# Patient Record
Sex: Female | Born: 1995 | Race: White | Hispanic: No | Marital: Single
Health system: Southern US, Community
[De-identification: ages and names within clinical notes are randomized; demographics above are authoritative.]

---
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
## 2017-03-12 ENCOUNTER — Other Ambulatory Visit: Payer: Self-pay | Admitting: Gastroenterology

## 2017-03-12 DIAGNOSIS — R1011 Right upper quadrant pain: Secondary | ICD-10-CM

## 2017-03-25 ENCOUNTER — Encounter (HOSPITAL_COMMUNITY): Payer: BLUE CROSS/BLUE SHIELD

## 2017-03-25 ENCOUNTER — Ambulatory Visit (HOSPITAL_COMMUNITY): Payer: BLUE CROSS/BLUE SHIELD

## 2017-03-29 ENCOUNTER — Ambulatory Visit (HOSPITAL_COMMUNITY)
Admission: RE | Admit: 2017-03-29 | Discharge: 2017-03-29 | Disposition: A | Discharge status: Home / Self Care | Payer: BLUE CROSS/BLUE SHIELD | Source: Ambulatory Visit | Attending: Gastroenterology | Admitting: Gastroenterology

## 2017-03-29 ENCOUNTER — Encounter (HOSPITAL_COMMUNITY)
Admission: RE | Admit: 2017-03-29 | Discharge: 2017-03-29 | Disposition: A | Discharge status: Home / Self Care | Payer: BLUE CROSS/BLUE SHIELD | Source: Ambulatory Visit | Attending: Gastroenterology | Admitting: Gastroenterology

## 2017-03-29 ENCOUNTER — Encounter (HOSPITAL_COMMUNITY): Payer: Self-pay

## 2017-03-29 DIAGNOSIS — R1011 Right upper quadrant pain: Secondary | ICD-10-CM | POA: Insufficient documentation

## 2017-03-29 DIAGNOSIS — R110 Nausea: Secondary | ICD-10-CM | POA: Diagnosis present

## 2017-03-29 MED ORDER — TECHNETIUM TC 99M MEBROFENIN IV KIT
5.0000 | PACK | Freq: Once | INTRAVENOUS | Status: AC | PRN
  Administered 2017-03-29: 5 via INTRAVENOUS

## 2018-03-13 ENCOUNTER — Other Ambulatory Visit: Payer: Self-pay | Admitting: Gastroenterology

## 2018-03-13 DIAGNOSIS — R1012 Left upper quadrant pain: Secondary | ICD-10-CM

## 2018-03-19 ENCOUNTER — Ambulatory Visit
Admission: RE | Admit: 2018-03-19 | Discharge: 2018-03-19 | Disposition: A | Discharge status: Home / Self Care | Payer: BLUE CROSS/BLUE SHIELD | Source: Ambulatory Visit | Attending: Gastroenterology | Admitting: Gastroenterology

## 2018-03-19 DIAGNOSIS — R1012 Left upper quadrant pain: Secondary | ICD-10-CM

## 2018-03-19 MED ORDER — IOPAMIDOL (ISOVUE-300) INJECTION 61%
100.0000 mL | Freq: Once | INTRAVENOUS | Status: AC | PRN
  Administered 2018-03-19: 100 mL via INTRAVENOUS

## 2018-09-06 IMAGING — NM NM HEPATO W/GB/PHARM/[PERSON_NAME]
3 series · 13 of 13 positions shown · non-contrast
Comparison: None.

CLINICAL DATA: Right upper quadrant pain.

EXAM:
NUCLEAR MEDICINE HEPATOBILIARY IMAGING WITH GALLBLADDER EF
TECHNIQUE: Sequential images of the abdomen were obtained [DATE] minutes
following intravenous administration of radiopharmaceutical. After
oral ingestion of Ensure, gallbladder ejection fraction was
determined. At 60 min, normal ejection fraction is greater than 33%.
RADIOPHARMACEUTICALS:  5.0 mCi Ec-JJm  Choletec IV

[he hepatobiliary · 3.10mm/px · 6 of 60 frames shown (1 of 3)]
[frame 6/60]
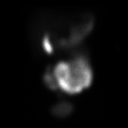
[frame 16/60]
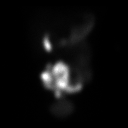
[frame 26/60]
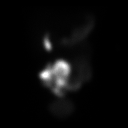
[frame 36/60]
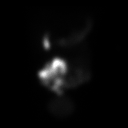
[frame 46/60]
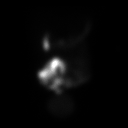
[frame 56/60]
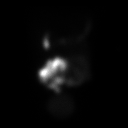

[he hepatobiliary · 2.26mm/px · 1 of 1 slices shown (2 of 3)]
[im 1/1]
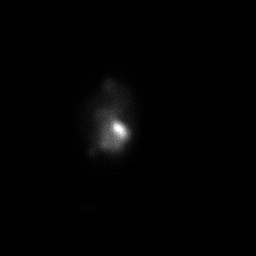

[he hepatobiliary · 3.10mm/px · 6 of 60 frames shown (3 of 3)]
[frame 6/60]
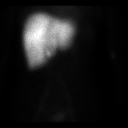
[frame 16/60]
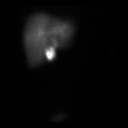
[frame 26/60]
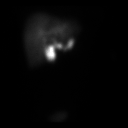
[frame 36/60]
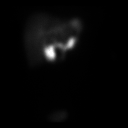
[frame 46/60]
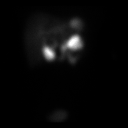
[frame 56/60]
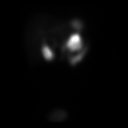

[13 of 13 positions shown; findings below may reference images not displayed]

FINDINGS: Prompt uptake and biliary excretion of activity by the liver is
seen. Gallbladder activity is visualized, consistent with patency of
cystic duct. Biliary activity passes into small bowel, consistent
with patent common bile duct.

Calculated gallbladder ejection fraction is 48%. (Normal gallbladder
ejection fraction with Ensure is greater than 33%.)
IMPRESSION: 1. Normal gallbladder ejection fraction.
2. Of note, there is reflux of biliary activity into the stomach
throughout the study.

## 2019-08-04 DIAGNOSIS — F329 Major depressive disorder, single episode, unspecified: Secondary | ICD-10-CM | POA: Insufficient documentation

## 2020-05-26 ENCOUNTER — Encounter (HOSPITAL_COMMUNITY): Payer: Self-pay | Admitting: Psychiatry

## 2020-05-26 ENCOUNTER — Telehealth (INDEPENDENT_AMBULATORY_CARE_PROVIDER_SITE_OTHER): Payer: 59 | Admitting: Psychiatry

## 2020-05-26 DIAGNOSIS — F330 Major depressive disorder, recurrent, mild: Secondary | ICD-10-CM | POA: Diagnosis not present

## 2020-05-26 DIAGNOSIS — K829 Disease of gallbladder, unspecified: Secondary | ICD-10-CM | POA: Insufficient documentation

## 2020-05-26 DIAGNOSIS — F411 Generalized anxiety disorder: Secondary | ICD-10-CM | POA: Diagnosis not present

## 2020-05-26 DIAGNOSIS — I471 Supraventricular tachycardia, unspecified: Secondary | ICD-10-CM | POA: Insufficient documentation

## 2020-05-26 DIAGNOSIS — M419 Scoliosis, unspecified: Secondary | ICD-10-CM | POA: Insufficient documentation

## 2020-05-26 NOTE — Progress Notes (Signed)
Psychiatric Initial Adult Assessment   Patient Identification: Dawn Duarte MRN:  [id number] Date of Evaluation:  05/26/2020 Referral Source: primary care Chief Complaint:  establish care, anxiety Visit Diagnosis:    ICD-10-CM   1. MDD (major depressive disorder), recurrent episode, mild (HCC)  F33.0   2. GAD (generalized anxiety disorder)  F41.1    Virtual Visit via Video Note  I connected with Jonita Albee on 05/26/20 at  2:00 PM EST by a video enabled telemedicine application and verified that I am speaking with the correct person using two identifiers.  Location: Patient: home  Provider: home office   I discussed the limitations of evaluation and management by telemedicine and the availability of in person appointments. The patient expressed understanding and agreed to proceed.    I discussed the assessment and treatment plan with the patient. The patient was provided an opportunity to ask questions and all were answered. The patient agreed with the plan and demonstrated an understanding of the instructions.   The patient was advised to call back or seek an in-person evaluation if the symptoms worsen or if the condition fails to improve as anticipated.  I provided 30  minutes of non-face-to-face time during this encounter.   Thresa Ross, MD  History of Present Illness: 25 years old currently living with her boyfriend white female referred by primary care physician for management and establishment of care for anxiety  Patient suffers from SVT and has got 2 times ablation done she sometimes gets palpitations and she worries she worries excessively about her physical health she has been also going through stress related with living back-and-forth between her family and her boyfriend family that was stressful they have finally moved into an Apt. 2 days ago and looking forward how it goes.  She does endorse worries excessive also she wanted to have her dog with her dad she has  to 2 hours as emotional support animal and was having a difficult time finding an apartment that would allow and she needed a letter for emotional support animal  Patient is having difficulty living with her boyfriend's family as she was worried about her dog.  She does suffer from depression at times feeling subdued decreased interest in things but sometimes he started feeling fearful of that but if something happens considering her medical condition and that starts her anxiety even worse.  She does have panic attacks at times  Her primary care physician is given her BuSpar that has helped but it was making her sedated.  She now takes only if needed basis and it does help to calm her anxiety.  There is no clear manic symptoms psychotic symptoms  Does not endorse using drugs or alcohol  Aggravating factors; dad had Covid, medical comorbidities including SVT and GI issues Modifying factors; her 2 dogs, boyfriend  Duration 2017 since she has been diagnosed with medical condition during SVT  Past psych admission or suicide attempt denies     Past Psychiatric History: anxiety  Previous Psychotropic Medications: Yes   Substance Abuse History in the last 12 months:  No.  Consequences of Substance Abuse: NA  Past Medical History: History reviewed. No pertinent past medical history. History reviewed. No pertinent surgical history.  Family Psychiatric History: sister: bipolar  Family History: History reviewed. No pertinent family history.  Social History:   Social History   Socioeconomic History  . Marital status: Single    Spouse name: Not on file  . Number of children: Not on file  .  Years of education: Not on file  . Highest education level: Not on file  Occupational History  . Not on file  Tobacco Use  . Smoking status: Not on file  . Smokeless tobacco: Not on file  Substance and Sexual Activity  . Alcohol use: Not on file  . Drug use: Not on file  . Sexual activity:  Not on file  Other Topics Concern  . Not on file  Social History Narrative  . Not on file   Social Determinants of Health   Financial Resource Strain: Not on file  Food Insecurity: Not on file  Transportation Needs: Not on file  Physical Activity: Not on file  Stress: Not on file  Social Connections: Not on file    Additional Social History: grew up with dad, was poor growing up. Later in high school grew up with mom. Dad had other kids and they started coming back and house got crowded Sister had bipolar  Allergies:  No Known Allergies  Metabolic Disorder Labs: No results found for: HGBA1C, MPG No results found for: PROLACTIN No results found for: CHOL, TRIG, HDL, CHOLHDL, VLDL, LDLCALC No results found for: TSH  Therapeutic Level Labs: No results found for: LITHIUM No results found for: CBMZ No results found for: VALPROATE  Current Medications: Current Outpatient Medications  Medication Sig Dispense Refill  . levonorgestrel-ethinyl estradiol (ALESSE) 0.1-20 MG-MCG tablet Take 1 tablet by mouth daily.    . pantoprazole (PROTONIX) 40 MG tablet Take a tablet at bedtime daily    . amitriptyline (ELAVIL) 25 MG tablet     . famotidine (PEPCID) 40 MG tablet Take 40 mg by mouth daily.    . TRULANCE 3 MG TABS      No current facility-administered medications for this visit.      Psychiatric Specialty Exam: Review of Systems  Cardiovascular: Negative for chest pain.  Psychiatric/Behavioral: Negative for agitation, confusion and self-injury.    There were no vitals taken for this visit.There is no height or weight on file to calculate BMI.  General Appearance: Casual  Eye Contact:  Fair  Speech:  Normal Rate  Volume:  Decreased  Mood:  somewhat subdued  Affect:  Congruent  Thought Process:  Goal Directed  Orientation:  Full (Time, Place, and Person)  Thought Content:  Rumination  Suicidal Thoughts:  No  Homicidal Thoughts:  No  Memory:  Immediate;   Fair Recent;    Fair  Judgement:  Fair  Insight:  Shallow  Psychomotor Activity:  Normal  Concentration:  Concentration: Fair and Attention Span: Fair  Recall:  [employer] of Knowledge:Good  Language: Good  Akathisia:  No  Handed:    AIMS (if indicated):  not done  Assets:  Communication Skills Desire for Improvement Housing  ADL's:  Intact  Cognition: WNL  Sleep:  Fair   Screenings:   Assessment and Plan: as follows Generalized anxiety disorder with panic attacks; she is doing somewhat better since she is living in an apartment now and away from the family stress.  She feels content with BuSpar only taking half or if needed basis.  We discussed to take it regularly so that she does not have anxiety as it does help.  She can take half of the 10 mg she does have the medication.  Other option discussed with [place] to start an SSRI like Lexapro she will call us back in 2 weeks if she needs to be on a medication that may help with anxiety  and depression both but she is more concerned about her anxiety and feels BuSpar does help  Major depressive recurrent mild to moderate; it looks like her depression is relevant to her anxiety and medical comorbidity when she starts worrying about her future and her physical health.  She will call us back in case she needs to start an antidepressant like SSRI.  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 Discussed and reviewed medication follow-up in 3 to 4 weeks or earlier if needed  Thresa Ross, MD 1/27/20222:31 PM

## 2020-06-23 ENCOUNTER — Telehealth (INDEPENDENT_AMBULATORY_CARE_PROVIDER_SITE_OTHER): Payer: 59 | Admitting: Psychiatry

## 2020-06-23 ENCOUNTER — Encounter (HOSPITAL_COMMUNITY): Payer: Self-pay | Admitting: Psychiatry

## 2020-06-23 DIAGNOSIS — F411 Generalized anxiety disorder: Secondary | ICD-10-CM

## 2020-06-23 DIAGNOSIS — F330 Major depressive disorder, recurrent, mild: Secondary | ICD-10-CM

## 2020-06-23 NOTE — Progress Notes (Signed)
Psychiatric Initial Adult Assessment   Patient Identification: Dawn Duarte MRN:  [id number] Date of Evaluation:  06/23/2020 Referral Source: primary care Chief Complaint:  establish care, anxiety Visit Diagnosis:    ICD-10-CM   1. MDD (major depressive disorder), recurrent episode, mild (HCC)  F33.0   2. GAD (generalized anxiety disorder)  F41.1    Virtual Visit via Video Note  I connected with Dawn Duarte on 06/23/20 at  3:30 PM EST by a video enabled telemedicine application and verified that I am speaking with the correct person using two identifiers.  Location: Patient: home Provider: office   I discussed the limitations of evaluation and management by telemedicine and the availability of in person appointments. The patient expressed understanding and agreed to proceed.      I discussed the assessment and treatment plan with the patient. The patient was provided an opportunity to ask questions and all were answered. The patient agreed with the plan and demonstrated an understanding of the instructions.   The patient was advised to call back or seek an in-person evaluation if the symptoms worsen or if the condition fails to improve as anticipated.  I provided 11 minutes of non-face-to-face time during this encounter.   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   Last visit was kept on buspar and hold off from starting ssri, she has improved  holter monitoring as verbal reported shows 109 per minute heart rate, says doing better  Anxiety improved  working has kept her busy as well  Does not endorse using drugs or alcohol  Aggravating factors; dad's  death by covid, medical comorbidities including SVT and GI issues Modifying factors; her 2 dogs, boyfriend  Duration 2017 since she has been diagnosed with medical condition during SVT  Past psych admission or suicide attempt denies     Past Psychiatric History: anxiety  Previous Psychotropic Medications: Yes   Substance Abuse History in the last 12 months:  No.  Consequences of Substance Abuse: NA  Past Medical History: History reviewed. No pertinent past medical history. History reviewed. No pertinent surgical history.  Family Psychiatric History: sister: bipolar  Family History: History reviewed. No pertinent family history.  Social History:   Social History   Socioeconomic History  . Marital status: Single    Spouse name: Not on file  . Number of children: Not on file  . Years of education: Not on file  . Highest education level: Not on file  Occupational History  . Not on file  Tobacco Use  . Smoking status: Not on file  . Smokeless tobacco: Not on file  Substance and Sexual Activity  . Alcohol use: Not on file  . Drug use: Not on file  . Sexual activity: Not on file  Other Topics Concern  . Not on file  Social History Narrative  . Not on file   Social Determinants of Health   Financial Resource Strain: Not on file  Food Insecurity: Not on file  Transportation Needs: Not on file  Physical Activity: Not on file  Stress: Not on file  Social Connections: Not on file      Allergies:  No Known Allergies  Metabolic Disorder  Labs: No results found for: HGBA1C, MPG No results found for: PROLACTIN No results found for: CHOL, TRIG, HDL, CHOLHDL, VLDL, LDLCALC No results found for: TSH  Therapeutic Level Labs: No results found for: LITHIUM No results found for: CBMZ No results found for: VALPROATE  Current Medications: Current Outpatient Medications  Medication Sig Dispense Refill  . amitriptyline (ELAVIL) 25 MG tablet     . busPIRone (BUSPAR) 10  MG tablet Take by mouth.    . famotidine (PEPCID) 40 MG tablet Take 40 mg by mouth daily.    Marland Kitchen levonorgestrel-ethinyl estradiol (ALESSE) 0.1-20 MG-MCG tablet Take 1 tablet by mouth daily.    . pantoprazole (PROTONIX) 40 MG tablet Take a tablet at bedtime daily    . TRULANCE 3 MG TABS      No current facility-administered medications for this visit.      Psychiatric Specialty Exam: Review of Systems  Cardiovascular: Negative for chest pain.  Psychiatric/Behavioral: Negative for agitation, confusion and self-injury.    There were no vitals taken for this visit.There is no height or weight on file to calculate BMI.  General Appearance: Casual  Eye Contact:  Fair  Speech:  Normal Rate  Volume:  Decreased  Mood:  fair  Affect:  Congruent  Thought Process:  Goal Directed  Orientation:  Full (Time, Place, and Person)  Thought Content:  Rumination  Suicidal Thoughts:  No  Homicidal Thoughts:  No  Memory:  Immediate;   Fair Recent;   Fair  Judgement:  Fair  Insight:  Shallow  Psychomotor Activity:  Normal  Concentration:  Concentration: Fair and Attention Span: Fair  Recall:  [employer] of Knowledge:Good  Language: Good  Akathisia:  No  Handed:    AIMS (if indicated):  not done  Assets:  Communication Skills Desire for Improvement Housing  ADL's:  Intact  Cognition: WNL  Sleep:  Fair   Screenings: PHQ2-9   Flowsheet Row Video Visit from [facility]23/2020 [facility] [place]  PHQ-2 Total Score 0    Flowsheet Row Video Visit from 06/23/2020 in [facility] AT [place]  C-SSRS RISK CATEGORY No Risk      Assessment and Plan: as follows Prior documentation reviewed  Generalized anxiety disorder with panic attacks;  Doing better, continue buspar , can keep hold off SSRI for now  Major depressive recurrent mild to moderate;improved: not feeling depressed  Fu 74m.   Thresa Ross, MD 2/24/20223:44 PM

## 2020-08-25 ENCOUNTER — Telehealth (HOSPITAL_COMMUNITY): Payer: 59 | Admitting: Psychiatry
